# Patient Record
Sex: Male | Born: 2005 | Race: Black or African American | Hispanic: No | Marital: Single | Smoking: Never smoker
Health system: Southern US, Community
[De-identification: ages and names within clinical notes are randomized; demographics above are authoritative.]

## PROBLEM LIST (undated history)

## (undated) ENCOUNTER — Emergency Department (HOSPITAL_BASED_OUTPATIENT_CLINIC_OR_DEPARTMENT_OTHER): Payer: Managed Care, Other (non HMO) | Source: Home / Self Care

## (undated) DIAGNOSIS — F840 Autistic disorder: Secondary | ICD-10-CM

---
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
## 2023-04-20 DIAGNOSIS — Z419 Encounter for procedure for purposes other than remedying health state, unspecified: Secondary | ICD-10-CM | POA: Diagnosis not present

## 2023-05-21 DIAGNOSIS — Z419 Encounter for procedure for purposes other than remedying health state, unspecified: Secondary | ICD-10-CM | POA: Diagnosis not present

## 2023-06-20 ENCOUNTER — Ambulatory Visit: Payer: 59 | Admitting: Family Medicine

## 2023-06-20 DIAGNOSIS — Z419 Encounter for procedure for purposes other than remedying health state, unspecified: Secondary | ICD-10-CM | POA: Diagnosis not present

## 2023-06-30 ENCOUNTER — Ambulatory Visit (INDEPENDENT_AMBULATORY_CARE_PROVIDER_SITE_OTHER): Payer: 59 | Admitting: Family Medicine

## 2023-06-30 ENCOUNTER — Encounter: Payer: Self-pay | Admitting: Family Medicine

## 2023-06-30 VITALS — BP 124/82 | HR 78 | Temp 98.9°F | Ht 74.0 in | Wt 293.0 lb

## 2023-06-30 DIAGNOSIS — J069 Acute upper respiratory infection, unspecified: Secondary | ICD-10-CM | POA: Insufficient documentation

## 2023-06-30 DIAGNOSIS — Z79899 Other long term (current) drug therapy: Secondary | ICD-10-CM | POA: Insufficient documentation

## 2023-06-30 DIAGNOSIS — Z91018 Allergy to other foods: Secondary | ICD-10-CM | POA: Insufficient documentation

## 2023-06-30 DIAGNOSIS — L309 Dermatitis, unspecified: Secondary | ICD-10-CM | POA: Insufficient documentation

## 2023-06-30 DIAGNOSIS — Z68.41 Body mass index (BMI) pediatric, greater than or equal to 95th percentile for age: Secondary | ICD-10-CM | POA: Insufficient documentation

## 2023-06-30 DIAGNOSIS — F849 Pervasive developmental disorder, unspecified: Secondary | ICD-10-CM | POA: Insufficient documentation

## 2023-06-30 DIAGNOSIS — F902 Attention-deficit hyperactivity disorder, combined type: Secondary | ICD-10-CM | POA: Diagnosis not present

## 2023-06-30 DIAGNOSIS — H6120 Impacted cerumen, unspecified ear: Secondary | ICD-10-CM | POA: Insufficient documentation

## 2023-06-30 DIAGNOSIS — R030 Elevated blood-pressure reading, without diagnosis of hypertension: Secondary | ICD-10-CM | POA: Diagnosis not present

## 2023-06-30 DIAGNOSIS — E669 Obesity, unspecified: Secondary | ICD-10-CM

## 2023-06-30 DIAGNOSIS — Z00129 Encounter for routine child health examination without abnormal findings: Secondary | ICD-10-CM | POA: Insufficient documentation

## 2023-06-30 DIAGNOSIS — Z719 Counseling, unspecified: Secondary | ICD-10-CM | POA: Insufficient documentation

## 2023-06-30 DIAGNOSIS — Z713 Dietary counseling and surveillance: Secondary | ICD-10-CM | POA: Insufficient documentation

## 2023-06-30 DIAGNOSIS — IMO0002 Reserved for concepts with insufficient information to code with codable children: Secondary | ICD-10-CM | POA: Insufficient documentation

## 2023-06-30 DIAGNOSIS — F840 Autistic disorder: Secondary | ICD-10-CM | POA: Diagnosis not present

## 2023-06-30 DIAGNOSIS — I10 Essential (primary) hypertension: Secondary | ICD-10-CM | POA: Insufficient documentation

## 2023-06-30 DIAGNOSIS — Z7182 Exercise counseling: Secondary | ICD-10-CM | POA: Insufficient documentation

## 2023-06-30 DIAGNOSIS — R6250 Unspecified lack of expected normal physiological development in childhood: Secondary | ICD-10-CM | POA: Insufficient documentation

## 2023-06-30 DIAGNOSIS — R197 Diarrhea, unspecified: Secondary | ICD-10-CM | POA: Insufficient documentation

## 2023-06-30 NOTE — Assessment & Plan Note (Signed)
Chronic, controlled, unmedicated since he has been out of school.

## 2023-06-30 NOTE — Assessment & Plan Note (Signed)
Mother requests authorization form for day program, will complete upon receipt. I have also requested medical records from [facility].

## 2023-06-30 NOTE — Progress Notes (Signed)
New Patient Office Visit  Subjective    Patient ID: Jeremy Ibarra, male    DOB: 08-Jan-2006  Age: 17 y.o. MRN: [id number]  CC:  Chief Complaint  Patient presents with   Establish Care    HPI Jeremy Ibarra presents to establish care. Oriented to practice routines and expectations. PMH includes autism and ADHD. His mother is with him today and she requests an authorization form for [facility]. His mother has also requested state psychiatry evaluations and will request medical records from [facility]. His BP is elevated in office today, mother reports history of BP elevations, no workup has been done for this. He denies chest pain, palpitations, dyspnea on exertion, recurrent headaches, vision changes.   Vaccines:  UTD    Outpatient Encounter Medications as of 06/30/2023  Medication Sig   cloNIDine HCl (KAPVAY) 0.1 MG TB12 ER tablet Take 0.1 mg by mouth at bedtime.   fluocinonide cream (LIDEX) 0.05 % Apply 1 Application topically 2 (two) times daily.   acetaminophen (TYLENOL) 160 MG/5ML solution Take 320 mg by mouth every 4 (four) hours as needed. For fever (Patient not taking: Reported on 06/30/2023)   dexmethylphenidate (FOCALIN XR) 10 MG 24 hr capsule Take 10 mg by mouth daily. (Patient not taking: Reported on 06/30/2023)   erythromycin ophthalmic ointment Place a 1/2 inch ribbon of ointment into the lower eyelid 4 times daily for 7 days. (Patient not taking: Reported on 06/30/2023)   fluticasone (FLONASE) 50 MCG/ACT nasal spray Place 1 spray into both nostrils daily for 3 days.   oseltamivir (TAMIFLU) 75 MG capsule Take 1 capsule (75 mg total) by mouth every 12 (twelve) hours. (Patient not taking: Reported on 06/30/2023)   No facility-administered encounter medications on file as of 06/30/2023.    Past Medical History:  Diagnosis Date   Autism     History reviewed. No pertinent surgical history.  History reviewed. No pertinent family history.  Social  History   Socioeconomic History   Marital status: Single    Spouse name: Not on file   Number of children: Not on file   Years of education: Not on file   Highest education level: Not on file  Occupational History   Not on file  Tobacco Use   Smoking status: Never   Smokeless tobacco: Never  Substance and Sexual Activity   Alcohol use: Never    Comment:  pt is 17yo   Drug use: Never   Sexual activity: Never  Other Topics Concern   Not on file  Social History Narrative   Not on file   Social Determinants of Health   Financial Resource Strain: Not on file  Food Insecurity: Not on file  Transportation Needs: Not on file  Physical Activity: Not on file  Stress: Not on file  Social Connections: Not on file  Intimate Partner Violence: Not on file    Review of Systems  Constitutional: Negative.   HENT: Negative.    Eyes: Negative.   Respiratory: Negative.    Cardiovascular: Negative.   Gastrointestinal: Negative.   Genitourinary: Negative.   Musculoskeletal: Negative.   Skin: Negative.   Neurological: Negative.   Endo/Heme/Allergies: Negative.   Psychiatric/Behavioral: Negative.    All other systems reviewed and are negative.       Objective    BP 124/82   Pulse 78   Temp 98.9 F (37.2 C) (Oral)   Ht 6\' 2"  (1.88 m)   Wt 293 lb (132.9 kg)  SpO2 98%   BMI 37.62 kg/m   Physical Exam Vitals and nursing note reviewed.  Constitutional:      Appearance: Normal appearance. He is normal weight.  HENT:     Head: Normocephalic and atraumatic.     Right Ear: Tympanic membrane, ear canal and external ear normal.     Left Ear: Tympanic membrane, ear canal and external ear normal.     Nose: Nose normal.     Mouth/Throat:     Mouth: Mucous membranes are moist.     Pharynx: Oropharynx is clear.  Eyes:     Extraocular Movements: Extraocular movements intact.     Right eye: Normal extraocular motion and no nystagmus.     Left eye: Normal extraocular motion and no  nystagmus.     Conjunctiva/sclera: Conjunctivae normal.     Pupils: Pupils are equal, round, and reactive to light.  Cardiovascular:     Rate and Rhythm: Normal rate and regular rhythm.     Pulses: Normal pulses.     Heart sounds: Normal heart sounds.  Pulmonary:     Effort: Pulmonary effort is normal.     Breath sounds: Normal breath sounds.  Abdominal:     General: Bowel sounds are normal.     Palpations: Abdomen is soft.  Genitourinary:    Comments: Deferred using shared decision making Musculoskeletal:        General: Normal range of motion.     Cervical back: Normal range of motion and neck supple.  Skin:    General: Skin is warm and dry.     Capillary Refill: Capillary refill takes less than 2 seconds.  Neurological:     General: No focal deficit present.     Mental Status: He is alert. Mental status is at baseline.  Psychiatric:        Mood and Affect: Mood normal.        Speech: Speech normal.        Behavior: Behavior normal.        Thought Content: Thought content normal.        Cognition and Memory: Cognition and memory normal.        Judgment: Judgment normal.         Assessment & Plan:   Problem List Items Addressed This Visit     Autism - Primary    Mother requests authorization form for day program, will complete upon receipt. I have also requested medical records from [facility].      Elevated blood pressure reading    BP elevated today. Will recheck when he returns for labs and treat as indicated. Counseled on importance of weight management, heart healthy diet, and 150 minutes moderate intensity exercise weekly. Seek medical care for chest pain, dyspnea on exertion, vision changes, lightheadedness, recurrent headaches. 124/82 on recheck, he does endorse some anxiety regarding todays visit.      Relevant Orders   CBC with Differential/Platelet   COMPLETE METABOLIC PANEL WITH GFR   Lipid panel   TSH   Attention deficit hyperactivity  disorder (ADHD), combined type, moderate    Chronic, controlled, unmedicated since he has been out of school.      Other Visit Diagnoses     Obesity (BMI 35.0-39.9 without comorbidity)       Relevant Orders   CBC with Differential/Platelet   COMPLETE METABOLIC PANEL WITH GFR   Lipid panel   TSH       Return for labs.   Hospital doctor  Jeannette How, FNP

## 2023-06-30 NOTE — Assessment & Plan Note (Signed)
BP elevated today. Will recheck when he returns for labs and treat as indicated. Counseled on importance of weight management, heart healthy diet, and 150 minutes moderate intensity exercise weekly. Seek medical care for chest pain, dyspnea on exertion, vision changes, lightheadedness, recurrent headaches. 124/82 on recheck, he does endorse some anxiety regarding todays visit.

## 2023-06-30 NOTE — Patient Instructions (Signed)
It was great to meet you today and I'm excited to have you join the [facility] practice. I hope you had a positive experience today! If you feel so inclined, please feel free to recommend our practice to friends and family. Zaydon Kinser, FNP-C  

## 2023-07-06 ENCOUNTER — Other Ambulatory Visit: Payer: 59

## 2023-07-08 ENCOUNTER — Other Ambulatory Visit: Payer: 59

## 2023-07-08 ENCOUNTER — Telehealth: Payer: Self-pay

## 2023-07-08 LAB — CBC WITH DIFFERENTIAL/PLATELET
Basophils Absolute: 37 cells/uL (ref 0–200)
MCH: 27 pg (ref 25.0–35.0)
MPV: 10 fL (ref 7.5–12.5)
Monocytes Relative: 8.8 %
RDW: 13.3 % (ref 11.0–15.0)

## 2023-07-08 NOTE — Telephone Encounter (Signed)
Pt's mom brought in an authorization request form to be completed by pcp. Pt's mom asks once forms are completed to be faxed to the number provided on form. Pt's mom completed an intake form. Intake form and authorization forms placed in nurse's folder. Please contact pt's mom once forms are completed and faxed.   Cb#: [phone]

## 2023-07-09 LAB — CBC WITH DIFFERENTIAL/PLATELET
Absolute Monocytes: 546 cells/uL (ref 200–900)
Basophils Relative: 0.6 %
Eosinophils Absolute: 143 cells/uL (ref 15–500)
Eosinophils Relative: 2.3 %
HCT: 47.4 % (ref 36.0–49.0)
Hemoglobin: 15.4 g/dL (ref 12.0–16.9)
Lymphs Abs: 1841 cells/uL (ref 1200–5200)
MCHC: 32.5 g/dL (ref 31.0–36.0)
MCV: 83 fL (ref 78.0–98.0)
Neutro Abs: 3633 cells/uL (ref 1800–8000)
Neutrophils Relative %: 58.6 %
Platelets: 232 10*3/uL (ref 140–400)
RBC: 5.71 10*6/uL — ABNORMAL HIGH (ref 4.10–5.70)
Total Lymphocyte: 29.7 %
WBC: 6.2 10*3/uL (ref 4.5–13.0)

## 2023-07-09 LAB — COMPLETE METABOLIC PANEL WITH GFR
AG Ratio: 1.5 (calc) (ref 1.0–2.5)
ALT: 14 U/L (ref 8–46)
AST: 14 U/L (ref 12–32)
Albumin: 4.7 g/dL (ref 3.6–5.1)
Alkaline phosphatase (APISO): 62 U/L (ref 46–169)
BUN: 11 mg/dL (ref 7–20)
CO2: 24 mmol/L (ref 20–32)
Calcium: 9.5 mg/dL (ref 8.9–10.4)
Chloride: 104 mmol/L (ref 98–110)
Creat: 1.04 mg/dL (ref 0.60–1.24)
Globulin: 3.1 g/dL (calc) (ref 2.1–3.5)
Glucose, Bld: 94 mg/dL (ref 65–99)
Potassium: 4.2 mmol/L (ref 3.8–5.1)
Sodium: 137 mmol/L (ref 135–146)
Total Bilirubin: 0.4 mg/dL (ref 0.2–1.1)
Total Protein: 7.8 g/dL (ref 6.3–8.2)
eGFR: 107 mL/min/{1.73_m2} (ref >=60)

## 2023-07-09 LAB — TSH: TSH: 0.69 mIU/L (ref 0.50–4.30)

## 2023-07-09 LAB — LIPID PANEL
Cholesterol: 161 mg/dL (ref <170)
HDL: 42 mg/dL — ABNORMAL LOW (ref >45)
LDL Cholesterol (Calc): 106 mg/dL (calc) (ref <110)
Non-HDL Cholesterol (Calc): 119 mg/dL (calc) (ref <120)
Total CHOL/HDL Ratio: 3.8 (calc) (ref <5.0)
Triglycerides: 45 mg/dL (ref <90)

## 2023-07-19 ENCOUNTER — Other Ambulatory Visit: Payer: Self-pay

## 2023-07-19 ENCOUNTER — Telehealth: Payer: Self-pay

## 2023-07-19 NOTE — Telephone Encounter (Signed)
authorization request form completed per FNP and give to front desk for pt to pick up.

## 2023-07-19 NOTE — Telephone Encounter (Signed)
Called pt's mom to let know ppw was available to pick up. Copy of ppw made for pt's chart.

## 2023-07-20 ENCOUNTER — Telehealth: Payer: Self-pay

## 2023-07-20 NOTE — Telephone Encounter (Signed)
Talked to mom this afternoon about pt's referral for Adult Day Program with [facility]. Told mom that per [facility] does not provide that service, however, I with check with [facility]'s provider relation's dept to find out where pt could get Adult Day program service that they are in contract with.  Well call mom back on Friday or next Monday at the latest. Voiced understanding per mom

## 2023-07-21 DIAGNOSIS — Z419 Encounter for procedure for purposes other than remedying health state, unspecified: Secondary | ICD-10-CM | POA: Diagnosis not present

## 2023-07-29 ENCOUNTER — Telehealth: Payer: Self-pay

## 2023-07-29 NOTE — Telephone Encounter (Signed)
Today pt's mom called regarding the PA for Adult Day Program.   After talking w/[facility] Provider Service-Rachel K [phone] confirmed that [facility] adult day program is 'Peer Support' which includes the following servics (speech delay service, autism, PDD and any mental disorders, community service, etc.)  Told pt's mom that per [facility], the first 24 units/sessions does not required a PA only after the 24th units. There is a provider in [place], called [facility] [phone],  Told pt's mom I will disregard this current PA and to call back after 24th session and we can fill out a new PA for continuation of service. Per mom agreed. Nothing further.

## 2023-08-16 ENCOUNTER — Telehealth: Payer: Self-pay

## 2023-08-16 NOTE — Telephone Encounter (Signed)
Pt's mom brought in ppw to be completed by pcp for pt to attend a day program. Once form is completed please fax to number provided on ppw. Please call pt's mom once ppw is completed, faxed, and available for pt's mom to pick up. Intake form completed and placed in nurse's folder for completion. Please advise.  Cb#: [phone]

## 2023-08-19 ENCOUNTER — Other Ambulatory Visit: Payer: 59

## 2023-08-23 ENCOUNTER — Other Ambulatory Visit: Payer: 59

## 2023-08-23 NOTE — Telephone Encounter (Signed)
Put in FNP green folder to be completed. Will notify pt when it's done.

## 2023-08-31 ENCOUNTER — Other Ambulatory Visit: Payer: 59

## 2023-08-31 ENCOUNTER — Other Ambulatory Visit: Payer: Self-pay

## 2023-08-31 DIAGNOSIS — Z111 Encounter for screening for respiratory tuberculosis: Secondary | ICD-10-CM

## 2023-09-03 LAB — QUANTIFERON-TB GOLD PLUS
Mitogen-NIL: >10 [IU]/mL
NIL: 0.03 [IU]/mL
QuantiFERON-TB Gold Plus: NEGATIVE
TB1-NIL: 0 [IU]/mL
TB2-NIL: 0 [IU]/mL

## 2023-09-13 ENCOUNTER — Other Ambulatory Visit (HOSPITAL_COMMUNITY): Payer: Self-pay

## 2023-09-13 ENCOUNTER — Ambulatory Visit: Payer: 59 | Admitting: Family Medicine

## 2023-09-13 ENCOUNTER — Encounter: Payer: Self-pay | Admitting: Family Medicine

## 2023-09-13 VITALS — BP 140/74 | HR 83 | Temp 97.8°F | Ht 74.0 in | Wt 293.0 lb

## 2023-09-13 DIAGNOSIS — R030 Elevated blood-pressure reading, without diagnosis of hypertension: Secondary | ICD-10-CM

## 2023-09-13 DIAGNOSIS — F840 Autistic disorder: Secondary | ICD-10-CM | POA: Diagnosis not present

## 2023-09-13 MED ORDER — BLOOD PRESSURE KIT
1.0000 | PACK | Freq: Once | 0 refills | Status: AC
  Filled 2023-09-13: qty 1, fill #0

## 2023-09-13 NOTE — Assessment & Plan Note (Signed)
I have completed forms for Jeremy Ibarra all day program today with the assistance of his mother who provided medical history. Front Information systems manager to fax.

## 2023-09-13 NOTE — Progress Notes (Signed)
Subjective:  HPI: Jeremy Ibarra is a 17 y.o. male presenting on 09/13/2023 for No chief complaint on file.   HPI Patient is in today for form completion for his all day program accompanied today by his mother. Mr Reum has a PMH of autism and ADHD and will be starting an ADP soon. Forms were completed with assistance from his mother as historian for medical information. His BP was elevated on initial exam and remained 140/74 on recheck. They do not have a home blood pressure monitor, I have encouraged them to obtain one and check BP at home and report to office if readings sustain >140/90.   Review of Systems  All other systems reviewed and are negative.   Relevant past medical history reviewed and updated as indicated.   Past Medical History:  Diagnosis Date   Autism      No past surgical history on file.  Allergies and medications reviewed and updated.   Current Outpatient Medications:    Blood Pressure KIT, 1 kit by Does not apply route once for 1 dose., Disp: 1 kit, Rfl: 0   fluocinonide cream (LIDEX) 0.05 %, Apply 1 Application topically 2 (two) times daily., Disp: , Rfl:    acetaminophen (TYLENOL) 160 MG/5ML solution, Take 320 mg by mouth every 4 (four) hours as needed. For fever (Patient not taking: Reported on 06/30/2023), Disp: , Rfl:    cloNIDine HCl (KAPVAY) 0.1 MG TB12 ER tablet, Take 0.1 mg by mouth at bedtime., Disp: , Rfl:    dexmethylphenidate (FOCALIN XR) 10 MG 24 hr capsule, Take 10 mg by mouth daily. (Patient not taking: Reported on 06/30/2023), Disp: , Rfl:    erythromycin ophthalmic ointment, Place a 1/2 inch ribbon of ointment into the lower eyelid 4 times daily for 7 days. (Patient not taking: Reported on 06/30/2023), Disp: 3.5 g, Rfl: 0   fluticasone (FLONASE) 50 MCG/ACT nasal spray, Place 1 spray into both nostrils daily for 3 days., Disp: 16 g, Rfl: 0   oseltamivir (TAMIFLU) 75 MG capsule, Take 1 capsule (75 mg total) by mouth every 12 (twelve) hours.  (Patient not taking: Reported on 06/30/2023), Disp: 10 capsule, Rfl: 0  Allergies  Allergen Reactions   Banana     Came up as allergic in an allergy test   Egg-Derived Products     Came up as allergic in an allergy test    Objective:   BP (!) 140/74 (BP Location: Left Arm, Patient Position: Sitting, Cuff Size: Large)   Pulse 83   Temp 97.8 F (36.6 C) (Oral)   Ht 6\' 2"  (1.88 m)   Wt 293 lb (132.9 kg)   SpO2 98%   BMI 37.62 kg/m      09/13/2023    2:40 PM 09/13/2023    2:19 PM 09/13/2023    2:16 PM  Vitals with BMI  Height   6\' 2"   Weight   293 lbs  BMI   37.6  Systolic 140 150 478  Diastolic 74 88 100  Pulse   83     Physical Exam Vitals and nursing note reviewed.  Constitutional:      Appearance: Normal appearance. He is normal weight.  HENT:     Head: Normocephalic and atraumatic.  Skin:    General: Skin is warm and dry.     Capillary Refill: Capillary refill takes less than 2 seconds.  Neurological:     General: No focal deficit present.     Mental Status: He is  alert and oriented to person, place, and time. Mental status is at baseline.  Psychiatric:        Mood and Affect: Mood normal.        Behavior: Behavior normal.        Thought Content: Thought content normal.        Judgment: Judgment normal.     Assessment & Plan:  Autism Assessment & Plan: I have completed forms for Mr Stadler all day program today with the assistance of his mother who provided medical history. Front Information systems manager to fax.   Elevated blood pressure reading Assessment & Plan: BP again elevated on initial check and improved on recheck. Will provide order for home BP cuff for monitoring. Return to office if home readings sustain >140/90.   Other orders -     Blood Pressure; 1 kit by Does not apply route once for 1 dose.  Dispense: 1 kit; Refill: 0     Follow up plan: Return if symptoms worsen or fail to improve.  Park Meo, FNP

## 2023-09-13 NOTE — Assessment & Plan Note (Signed)
BP again elevated on initial check and improved on recheck. Will provide order for home BP cuff for monitoring. Return to office if home readings sustain >140/90.

## 2023-09-29 ENCOUNTER — Telehealth: Payer: Self-pay

## 2023-09-29 MED ORDER — FLUOCINONIDE 0.05 % EX CREA: 1.0000 | TOPICAL_CREAM | Freq: Two times a day (BID) | CUTANEOUS | 3 refills | Status: DC

## 2023-09-29 MED ORDER — COMFORT TOUCH BP CUFF/LARGE MISC: 0 refills | Status: DC

## 2023-09-29 NOTE — Telephone Encounter (Signed)
Spoke w/pt's mom, aware Rx for bp cuff and lidex 0.05% has been sent to pharmacy.

## 2023-09-29 NOTE — Telephone Encounter (Signed)
Pt's mom called in to check on the status of pt's bp cuff being called into pharmacy. Please advise.  Cb#: [phone]

## 2023-12-23 ENCOUNTER — Telehealth: Payer: Self-pay

## 2023-12-23 NOTE — Telephone Encounter (Signed)
 Rec' form fax from [place] [employer] re: Pt  Called and spoke w/mom, per NP wanted to find out why pt can't take the bus.   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 Mom, does not pt to be around people who don't understand Autism. Stated that if pt rides the bus, he would need an assistance or someone with him.   Put forms back on NP's desk to be completed.

## 2023-12-26 NOTE — Telephone Encounter (Signed)
 Paper work completed per pt's mom requested per NP and put up front to be faxed to [place] [address] [place] [address]. This morning.

## 2024-02-20 ENCOUNTER — Ambulatory Visit (INDEPENDENT_AMBULATORY_CARE_PROVIDER_SITE_OTHER): Payer: MEDICAID | Admitting: Family Medicine

## 2024-02-20 ENCOUNTER — Encounter: Payer: Self-pay | Admitting: Family Medicine

## 2024-02-20 VITALS — BP 138/88 | HR 92 | Temp 97.9°F | Ht 74.0 in | Wt 309.0 lb

## 2024-02-20 DIAGNOSIS — J3089 Other allergic rhinitis: Secondary | ICD-10-CM | POA: Diagnosis not present

## 2024-02-20 MED ORDER — HYPROMELLOSE (GONIOSCOPIC) 2.5 % OP SOLN: 1.0000 [drp] | Freq: Every day | OPHTHALMIC | 12 refills | Status: DC | PRN

## 2024-02-20 MED ORDER — FLUOCINONIDE 0.05 % EX SOLN: 1.0000 | Freq: Two times a day (BID) | CUTANEOUS | 0 refills | Status: DC

## 2024-02-20 NOTE — Progress Notes (Signed)
 Subjective:  HPI: Jeremy Ibarra is a 18 y.o. male presenting on 02/20/2024 for No chief complaint on file.   HPI Patient is in today for watery eyes that is mostly resolved as reported by his mother who aids in history. He and his mother deny redness, itching, vision changes, purulent drainage, crusting. He does not have associated allergy symptoms including sneezing, cough, runny nose. Has tried nothing.   Review of Systems  All other systems reviewed and are negative.   Relevant past medical history reviewed and updated as indicated.   Past Medical History:  Diagnosis Date   Autism      History reviewed. No pertinent surgical history.  Allergies and medications reviewed and updated.   Current Outpatient Medications:    acetaminophen (TYLENOL) 160 MG/5ML solution, Take 320 mg by mouth every 4 (four) hours as needed. For fever, Disp: , Rfl:    Blood Pressure Monitoring (COMFORT TOUCH BP CUFF/LARGE) MISC, Use as directed, Disp: 1 each, Rfl: 0   cloNIDine HCl (KAPVAY) 0.1 MG TB12 ER tablet, Take 0.1 mg by mouth at bedtime., Disp: , Rfl:    dexmethylphenidate (FOCALIN XR) 10 MG 24 hr capsule, Take 10 mg by mouth daily., Disp: , Rfl:    erythromycin ophthalmic ointment, Place a 1/2 inch ribbon of ointment into the lower eyelid 4 times daily for 7 days., Disp: 3.5 g, Rfl: 0   fluocinonide (LIDEX) 0.05 % external solution, Apply 1 Application topically 2 (two) times daily., Disp: 60 mL, Rfl: 0   hydroxypropyl methylcellulose / hypromellose (ISOPTO TEARS / GONIOVISC) 2.5 % ophthalmic solution, Place 1 drop into both eyes daily as needed for dry eyes., Disp: 15 mL, Rfl: 12   oseltamivir (TAMIFLU) 75 MG capsule, Take 1 capsule (75 mg total) by mouth every 12 (twelve) hours., Disp: 10 capsule, Rfl: 0   fluticasone (FLONASE) 50 MCG/ACT nasal spray, Place 1 spray into both nostrils daily for 3 days., Disp: 16 g, Rfl: 0  Allergies  Allergen Reactions   Banana     Came up as allergic in an  allergy test   Egg-Derived Products     Came up as allergic in an allergy test    Objective:   BP 138/88 (BP Location: Right Arm, Patient Position: Sitting, Cuff Size: Large)   Pulse 92   Temp 97.9 F (36.6 C) (Oral)   Ht 6\' 2"  (1.88 m)   Wt (!) 309 lb (140.2 kg)   SpO2 97%   BMI 39.67 kg/m      02/20/2024    3:52 PM 02/20/2024    3:44 PM 02/20/2024    3:39 PM  Vitals with BMI  Height   6\' 2"   Weight   309 lbs  BMI   39.66  Systolic 138 150 161  Diastolic 88 96 110  Pulse   92     Physical Exam Vitals and nursing note reviewed.  Constitutional:      Appearance: Normal appearance. He is normal weight.  HENT:     Head: Normocephalic and atraumatic.  Eyes:     General: Lids are normal. Vision grossly intact. Gaze aligned appropriately.        Right eye: No discharge.        Left eye: No discharge.     Extraocular Movements: Extraocular movements intact.     Conjunctiva/sclera: Conjunctivae normal.  Skin:    General: Skin is warm and dry.     Capillary Refill: Capillary refill takes less than 2  seconds.  Neurological:     General: No focal deficit present.     Mental Status: He is alert and oriented to person, place, and time. Mental status is at baseline.  Psychiatric:        Mood and Affect: Mood normal.        Behavior: Behavior normal.        Thought Content: Thought content normal.        Judgment: Judgment normal.     Assessment & Plan:  Environmental and seasonal allergies Assessment & Plan: No injected conjunctiva or drainage observed. May use artificial tears PRN. Has been using Claritin and seen some improvement. Return to office PRN.    Other orders -     Fluocinonide; Apply 1 Application topically 2 (two) times daily.  Dispense: 60 mL; Refill: 0 -     Hypromellose (Gonioscopic); Place 1 drop into both eyes daily as needed for dry eyes.  Dispense: 15 mL; Refill: 12     Follow up plan: Return if symptoms worsen or fail to improve.  Park Meo, FNP

## 2024-02-20 NOTE — Assessment & Plan Note (Signed)
 No injected conjunctiva or drainage observed. May use artificial tears PRN. Has been using Claritin and seen some improvement. Return to office PRN.

## 2024-09-04 ENCOUNTER — Encounter: Payer: MEDICAID | Admitting: Family Medicine

## 2024-09-04 ENCOUNTER — Ambulatory Visit: Payer: MEDICAID | Admitting: Family Medicine

## 2024-09-11 ENCOUNTER — Ambulatory Visit: Payer: MEDICAID | Admitting: Family Medicine

## 2024-09-11 ENCOUNTER — Encounter: Payer: Self-pay | Admitting: Family Medicine

## 2024-09-11 VITALS — BP 130/90 | HR 89 | Temp 98.2°F | Ht 74.0 in | Wt 312.6 lb

## 2024-09-11 DIAGNOSIS — I10 Essential (primary) hypertension: Secondary | ICD-10-CM | POA: Diagnosis not present

## 2024-09-11 DIAGNOSIS — F902 Attention-deficit hyperactivity disorder, combined type: Secondary | ICD-10-CM | POA: Diagnosis not present

## 2024-09-11 DIAGNOSIS — Z0000 Encounter for general adult medical examination without abnormal findings: Secondary | ICD-10-CM | POA: Insufficient documentation

## 2024-09-11 DIAGNOSIS — L730 Acne keloid: Secondary | ICD-10-CM

## 2024-09-11 DIAGNOSIS — Z0001 Encounter for general adult medical examination with abnormal findings: Secondary | ICD-10-CM | POA: Diagnosis not present

## 2024-09-11 DIAGNOSIS — F840 Autistic disorder: Secondary | ICD-10-CM

## 2024-09-11 MED ORDER — FLUOCINONIDE 0.05 % EX SOLN: 1.0000 | Freq: Two times a day (BID) | CUTANEOUS | 0 refills | Status: AC

## 2024-09-11 MED ORDER — FLUOCINONIDE 0.05 % EX SOLN: 1.0000 | Freq: Two times a day (BID) | CUTANEOUS | 0 refills | Status: DC

## 2024-09-11 MED ORDER — CLINDAMYCIN PHOS-BENZOYL PEROX 1.2-2.5 % EX GEL: 1.0000 | Freq: Every day | CUTANEOUS | 0 refills | Status: AC

## 2024-09-11 NOTE — Assessment & Plan Note (Signed)
Today your medical history was reviewed and routine physical exam with labs was performed. Recommend 150 minutes of moderate intensity exercise weekly and consuming a well-balanced diet. Counseled in mental health awareness and when to seek medical care. Vaccine maintenance discussed. Appropriate health maintenance items reviewed. Return to office in 1 year for annual physical exam.

## 2024-09-11 NOTE — Progress Notes (Signed)
 Complete physical exam  Patient: Jeremy Ibarra   DOB: August 15, 2006   18 y.o. Male  MRN: [id number]  Subjective:    Chief Complaint  Patient presents with   Annual Exam    Jeremy Ibarra is a 18 y.o. male who presents today with his mother for a complete physical exam. He reports consuming a general diet. Walking and plays basketball at the [employer] He generally feels well. He reports sleeping well. He does not have additional problems to discuss today.   PMH includes Autism, ADHD, HTN, obesity.  Autism: participates in a day program  ADHD: not currently medicated, well controlled  HTN: working on diet and exercise  Obesity: working on diet and exercise, mother is seeing a nutritionist and planning to incorporate healthier meals.   Acne keloidalis  Health Maintenance  Topic Date Due   HPV VACCINES (1 - Male 3-dose series) Never done   HIV Screening  Never done   Meningococcal B Vaccine (1 of 2 - Standard) Never done   Hepatitis C Screening  Never done   COVID-19 Vaccine (1 - 2024-25 season) 09/27/2024 (Originally 08/20/2024)   Influenza Vaccine  10/03/2024 (Originally 07/20/2024)   DTaP/Tdap/Td (7 - Td or Tdap) 04/13/2026   Hepatitis B Vaccines 19-59 Average Risk  Completed   Pneumococcal Vaccine  Aged Out     Most recent fall risk assessment:    06/30/2023    3:59 PM  Fall Risk   Falls in the past year? 0  Number falls in past yr: 0  Injury with Fall? 0  Risk for fall due to : No Fall Risks     Most recent depression screenings:    06/30/2023    4:00 PM 03/31/2017    6:02 PM  PHQ 2/9 Scores  PHQ - 2 Score 0 0  PHQ- 9 Score 0 0    Vision:Not within last year  and Dental: No current dental problems and Receives regular dental care  Patient Active Problem List   Diagnosis Date Noted   Acne keloidalis 09/11/2024   Physical exam, annual 09/11/2024   Developmental delay 06/30/2023   Attention deficit hyperactivity disorder (ADHD), combined type, moderate 06/30/2023    Eczema 06/30/2023   Hypertension, essential 06/30/2023   Morbid obesity, unspecified obesity type (HCC) 06/30/2023   PDD (pervasive developmental disorder) 06/30/2023   Autism 03/31/2017   Past Medical History:  Diagnosis Date   Autism    History reviewed. No pertinent surgical history. Social History   Tobacco Use   Smoking status: Never   Smokeless tobacco: Never  Vaping Use   Vaping status: Never Used  Substance Use Topics   Alcohol use: Never    Comment:  pt is 18yo   Drug use: Never   History reviewed. No pertinent family history. Allergies  Allergen Reactions   Banana     Came up as allergic in an allergy test   Egg-Derived Products     Came up as allergic in an allergy test      Patient Care Team: Kayla Jeoffrey RAMAN, FNP as PCP - General (Family Medicine)   Outpatient Medications Prior to Visit  Medication Sig   acetaminophen  (TYLENOL ) 160 MG/5ML solution Take 320 mg by mouth every 4 (four) hours as needed. For fever (Patient taking differently: Take 320 mg by mouth every 4 (four) hours as needed for mild pain (pain score 1-3). For fever)   fluticasone  (FLONASE ) 50 MCG/ACT nasal spray Place 1 spray into both nostrils daily for  3 days. (Patient taking differently: Place 1 spray into both nostrils as needed for allergies or rhinitis.)   [DISCONTINUED] fluocinonide  (LIDEX ) 0.05 % external solution Apply 1 Application topically 2 (two) times daily.   [DISCONTINUED] Blood Pressure Monitoring (COMFORT TOUCH BP CUFF/LARGE) MISC Use as directed   [DISCONTINUED] cloNIDine HCl (KAPVAY) 0.1 MG TB12 ER tablet Take 0.1 mg by mouth at bedtime.   [DISCONTINUED] dexmethylphenidate (FOCALIN XR) 10 MG 24 hr capsule Take 10 mg by mouth daily. (Patient not taking: Reported on 09/11/2024)   [DISCONTINUED] erythromycin  ophthalmic ointment Place a 1/2 inch ribbon of ointment into the lower eyelid 4 times daily for 7 days.   [DISCONTINUED] hydroxypropyl methylcellulose / hypromellose (ISOPTO  TEARS / GONIOVISC) 2.5 % ophthalmic solution Place 1 drop into both eyes daily as needed for dry eyes.   [DISCONTINUED] oseltamivir  (TAMIFLU ) 75 MG capsule Take 1 capsule (75 mg total) by mouth every 12 (twelve) hours.   No facility-administered medications prior to visit.    Review of Systems  Constitutional: Negative.   HENT: Negative.    Eyes: Negative.   Respiratory: Negative.    Cardiovascular: Negative.   Gastrointestinal: Negative.   Genitourinary: Negative.   Musculoskeletal: Negative.   Skin: Negative.   Neurological: Negative.   Endo/Heme/Allergies: Negative.   Psychiatric/Behavioral: Negative.    All other systems reviewed and are negative.         Objective:     BP (!) 130/90   Pulse 89   Temp 98.2 F (36.8 C)   Ht 6' 2 (1.88 m)   Wt (!) 312 lb 9.6 oz (141.8 kg)   SpO2 98%   BMI 40.14 kg/m  BP Readings from Last 3 Encounters:  09/11/24 (!) 130/90  02/20/24 138/88  09/13/23 (!) 140/74   Wt Readings from Last 3 Encounters:  09/11/24 (!) 312 lb 9.6 oz (141.8 kg)  02/20/24 (!) 309 lb (140.2 kg) (>99%, Z= 3.16)*  09/13/23 293 lb (132.9 kg) (>99%, Z= 2.98)*   * Growth percentiles are based on CDC (Boys, 2-20 Years) data.      Physical Exam Vitals and nursing note reviewed.  Constitutional:      Appearance: Normal appearance. He is obese.  HENT:     Head: Normocephalic and atraumatic.     Right Ear: Tympanic membrane, ear canal and external ear normal.     Left Ear: Tympanic membrane, ear canal and external ear normal.     Nose: Nose normal.     Mouth/Throat:     Mouth: Mucous membranes are moist.     Pharynx: Oropharynx is clear.  Eyes:     Extraocular Movements: Extraocular movements intact.     Right eye: Normal extraocular motion and no nystagmus.     Left eye: Normal extraocular motion and no nystagmus.     Conjunctiva/sclera: Conjunctivae normal.     Pupils: Pupils are equal, round, and reactive to light.  Cardiovascular:     Rate and  Rhythm: Normal rate and regular rhythm.     Pulses: Normal pulses.     Heart sounds: Normal heart sounds.  Pulmonary:     Effort: Pulmonary effort is normal.     Breath sounds: Normal breath sounds.  Abdominal:     General: Bowel sounds are normal.     Palpations: Abdomen is soft.  Genitourinary:    Comments: Deferred using shared decision making Musculoskeletal:        General: Normal range of motion.     Cervical back:  Normal range of motion and neck supple.  Skin:    General: Skin is warm and dry.     Capillary Refill: Capillary refill takes less than 2 seconds.     Findings: Acne present.      Neurological:     General: No focal deficit present.     Mental Status: He is alert. Mental status is at baseline.  Psychiatric:        Mood and Affect: Mood normal.        Speech: Speech normal.        Behavior: Behavior normal.        Thought Content: Thought content normal.        Cognition and Memory: Cognition and memory normal.        Judgment: Judgment normal.      No results found for any visits on 09/11/24. Last CBC Lab Results  Component Value Date   WBC 6.2 07/08/2023   HGB 15.4 07/08/2023   HCT 47.4 07/08/2023   MCV 83.0 07/08/2023   MCH 27.0 07/08/2023   RDW 13.3 07/08/2023   PLT 232 07/08/2023   Last metabolic panel Lab Results  Component Value Date   GLUCOSE 94 07/08/2023   NA 137 07/08/2023   K 4.2 07/08/2023   CL 104 07/08/2023   CO2 24 07/08/2023   BUN 11 07/08/2023   CREATININE 1.04 07/08/2023   EGFR 107 07/08/2023   CALCIUM 9.5 07/08/2023   PROT 7.8 07/08/2023   BILITOT 0.4 07/08/2023   AST 14 07/08/2023   ALT 14 07/08/2023   Last lipids Lab Results  Component Value Date   CHOL 161 07/08/2023   HDL 42 (L) 07/08/2023   LDLCALC 106 07/08/2023   TRIG 45 07/08/2023   CHOLHDL 3.8 07/08/2023   Last hemoglobin A1c No results found for: HGBA1C Last thyroid functions Lab Results  Component Value Date   TSH 0.69 07/08/2023   Last  vitamin D  No results found for: 25OHVITD2, 25OHVITD3, VD25OH Last vitamin B12 and Folate No results found for: VITAMINB12, FOLATE      Assessment & Plan:    Routine Health Maintenance and Physical Exam  Immunization History  Administered Date(s) Administered   DTaP 09/23/2004, 11/23/2004, 02/23/2005, 10/27/2005, 07/25/2009   HIB (PRP-OMP) 09/23/2004, 11/23/2004, 07/26/2005   Hepatitis A, Ped/Adol-2 Dose 08/04/2006, 08/15/2007   Hepatitis B, PED/ADOLESCENT 07-06-2004, 08/25/2004, 05/24/2005   IPV 09/23/2004, 11/23/2004, 05/24/2005, 07/25/2009   MMR 07/26/2005, 07/25/2009   Tdap 04/13/2016   Varicella 10/27/2005, 07/25/2009    Health Maintenance  Topic Date Due   HPV VACCINES (1 - Male 3-dose series) Never done   HIV Screening  Never done   Meningococcal B Vaccine (1 of 2 - Standard) Never done   Hepatitis C Screening  Never done   COVID-19 Vaccine (1 - 2024-25 season) 09/27/2024 (Originally 08/20/2024)   Influenza Vaccine  10/03/2024 (Originally 07/20/2024)   DTaP/Tdap/Td (7 - Td or Tdap) 04/13/2026   Hepatitis B Vaccines 19-59 Average Risk  Completed   Pneumococcal Vaccine  Aged Out    Discussed health benefits of physical activity, and encouraged him to engage in regular exercise appropriate for his age and condition.  Problem List Items Addressed This Visit     Autism   I have completed forms for [facility] today with the assistance of his mother who provided medical history. Front Information systems manager to fax.      Attention deficit hyperactivity disorder (ADHD), combined type, moderate   Stable  without medication      Hypertension, essential   Borderline, continue to monitor. Advised on heart healthy diet and exercise.  Recommend heart healthy diet such as Mediterranean diet with whole grains, fruits, vegetable, fish, lean meats, nuts, and olive oil. Limit salt. Encouraged moderate walking, 3-5 times/week for 30-50 minutes each session. Aim for at least  150 minutes.week. Goal should be pace of 3 miles/hours, or walking 1.5 miles in 30 minutes. Avoid tobacco products. Avoid excess alcohol. Take medications as prescribed and bring medications and blood pressure log with cuff to each office visit. Seek medical care for chest pain, palpitations, shortness of breath with exertion, dizziness/lightheadedness, vision changes, recurrent headaches, or swelling of extremities.       Relevant Orders   CBC with Differential/Platelet   Comprehensive metabolic panel with GFR   Lipid panel   TSH   VITAMIN D  25 Hydroxy (Vit-D Deficiency, Fractures)   Hemoglobin A1c   Morbid obesity, unspecified obesity type (HCC)   Counseled on importance of weight management for overall health. Encouraged low calorie, heart healthy diet and moderate intensity exercise 150 minutes weekly. This is 3-5 times weekly for 30-50 minutes each session. Goal should be pace of 3 miles/hours, or walking 1.5 miles in 30 minutes and include strength training. Discussed risks of obesity.       Relevant Orders   CBC with Differential/Platelet   Comprehensive metabolic panel with GFR   Lipid panel   TSH   VITAMIN D  25 Hydroxy (Vit-D Deficiency, Fractures)   Hemoglobin A1c   Acne keloidalis   Restart Lidex  and start Clindamycin -Benzoyl peroxide Avoid picking, rubbing, or scratching the affected area. Discontinue close shaving, trimming, or razor or clipper edging of the posterior hairline. Avoid irritation from tight-fitting hats, helmets, or high-collared shirts Follow up in 4-6 weeks      Physical exam, annual - Primary   Today your medical history was reviewed and routine physical exam with labs was performed. Recommend 150 minutes of moderate intensity exercise weekly and consuming a well-balanced diet.  Counseled in mental health awareness and when to seek medical care. Vaccine maintenance discussed. Appropriate health maintenance items reviewed. Return to office in 1 year for  annual physical exam.       Relevant Orders   CBC with Differential/Platelet   Comprehensive metabolic panel with GFR   Lipid panel   TSH   VITAMIN D  25 Hydroxy (Vit-D Deficiency, Fractures)   Hemoglobin A1c   Return in about 1 year (around 09/11/2025) for annual physical with labs 1 week prior.     [name] Barrio, FNP

## 2024-09-11 NOTE — Assessment & Plan Note (Addendum)
 Restart Lidex  and start Clindamycin -Benzoyl peroxide Avoid picking, rubbing, or scratching the affected area. Discontinue close shaving, trimming, or razor or clipper edging of the posterior hairline. Avoid irritation from tight-fitting hats, helmets, or high-collared shirts Follow up in 4-6 weeks

## 2024-09-11 NOTE — Assessment & Plan Note (Signed)
 Counseled on importance of weight management for overall health. Encouraged low calorie, heart healthy diet and moderate intensity exercise 150 minutes weekly. This is 3-5 times weekly for 30-50 minutes each session. Goal should be pace of 3 miles/hours, or walking 1.5 miles in 30 minutes and include strength training. Discussed risks of obesity.

## 2024-09-11 NOTE — Assessment & Plan Note (Signed)
 Stable without medication

## 2024-09-11 NOTE — Assessment & Plan Note (Addendum)
 Borderline, continue to monitor. Advised on heart healthy diet and exercise.  Recommend heart healthy diet such as Mediterranean diet with whole grains, fruits, vegetable, fish, lean meats, nuts, and olive oil. Limit salt. Encouraged moderate walking, 3-5 times/week for 30-50 minutes each session. Aim for at least 150 minutes.week. Goal should be pace of 3 miles/hours, or walking 1.5 miles in 30 minutes. Avoid tobacco products. Avoid excess alcohol. Take medications as prescribed and bring medications and blood pressure log with cuff to each office visit. Seek medical care for chest pain, palpitations, shortness of breath with exertion, dizziness/lightheadedness, vision changes, recurrent headaches, or swelling of extremities.

## 2024-09-11 NOTE — Assessment & Plan Note (Signed)
I have completed forms for Jeremy Ibarra all day program today with the assistance of his mother who provided medical history. Front Information systems manager to fax.

## 2024-09-12 ENCOUNTER — Other Ambulatory Visit: Payer: Self-pay

## 2024-09-12 ENCOUNTER — Ambulatory Visit: Payer: Self-pay | Admitting: Family Medicine

## 2024-09-12 DIAGNOSIS — R7989 Other specified abnormal findings of blood chemistry: Secondary | ICD-10-CM

## 2024-09-12 LAB — CBC WITH DIFFERENTIAL/PLATELET
Absolute Lymphocytes: 2193 {cells}/uL (ref 850–3900)
Absolute Monocytes: 791 {cells}/uL (ref 200–950)
Basophils Absolute: 17 {cells}/uL (ref 0–200)
Basophils Relative: 0.2 %
Eosinophils Absolute: 172 {cells}/uL (ref 15–500)
Eosinophils Relative: 2 %
HCT: 44.3 % (ref 38.5–50.0)
Hemoglobin: 14.6 g/dL (ref 13.2–17.1)
MCH: 26.9 pg — ABNORMAL LOW (ref 27.0–33.0)
MCHC: 33 g/dL (ref 32.0–36.0)
MCV: 81.6 fL (ref 80.0–100.0)
MPV: 10.5 fL (ref 7.5–12.5)
Monocytes Relative: 9.2 %
Neutro Abs: 5427 {cells}/uL (ref 1500–7800)
Neutrophils Relative %: 63.1 %
Platelets: 231 Thousand/uL (ref 140–400)
RBC: 5.43 Million/uL (ref 4.20–5.80)
RDW: 13.1 % (ref 11.0–15.0)
Total Lymphocyte: 25.5 %
WBC: 8.6 Thousand/uL (ref 3.8–10.8)

## 2024-09-12 LAB — HEMOGLOBIN A1C
Hgb A1c MFr Bld: 5.4 % (ref <5.7)
Mean Plasma Glucose: 108 mg/dL
eAG (mmol/L): 6 mmol/L

## 2024-09-12 LAB — LIPID PANEL
Cholesterol: 174 mg/dL (ref <200)
HDL: 39 mg/dL — ABNORMAL LOW (ref >=40)
LDL Cholesterol (Calc): 116 mg/dL — ABNORMAL HIGH
Non-HDL Cholesterol (Calc): 135 mg/dL — ABNORMAL HIGH (ref <130)
Total CHOL/HDL Ratio: 4.5 (calc) (ref <5.0)
Triglycerides: 87 mg/dL (ref <150)

## 2024-09-12 LAB — COMPREHENSIVE METABOLIC PANEL WITH GFR
AG Ratio: 1.6 (calc) (ref 1.0–2.5)
ALT: 15 U/L (ref 9–46)
AST: 14 U/L (ref 10–40)
Albumin: 4.5 g/dL (ref 3.6–5.1)
Alkaline phosphatase (APISO): 60 U/L (ref 36–130)
BUN: 11 mg/dL (ref 7–25)
CO2: 26 mmol/L (ref 20–32)
Calcium: 9.4 mg/dL (ref 8.6–10.3)
Chloride: 104 mmol/L (ref 98–110)
Creat: 1.03 mg/dL (ref 0.60–1.24)
Globulin: 2.9 g/dL (ref 1.9–3.7)
Glucose, Bld: 114 mg/dL — ABNORMAL HIGH (ref 65–99)
Potassium: 4.1 mmol/L (ref 3.5–5.3)
Sodium: 138 mmol/L (ref 135–146)
Total Bilirubin: 0.3 mg/dL (ref 0.2–1.2)
Total Protein: 7.4 g/dL (ref 6.1–8.1)
eGFR: 107 mL/min/1.73m2 (ref >=60)

## 2024-09-12 LAB — TSH: TSH: 0.71 m[IU]/L (ref 0.40–4.50)

## 2024-09-12 LAB — VITAMIN D 25 HYDROXY (VIT D DEFICIENCY, FRACTURES): Vit D, 25-Hydroxy: 8 ng/mL — ABNORMAL LOW (ref 30–100)

## 2024-09-12 MED ORDER — VITAMIN D (ERGOCALCIFEROL) 1.25 MG (50000 UNIT) PO CAPS: 50000.0000 [IU] | ORAL_CAPSULE | ORAL | 0 refills | Status: AC

## 2024-09-12 MED ORDER — CHOLECALCIFEROL 1.25 MG (50000 UT) PO TABS: 1.0000 | ORAL_TABLET | ORAL | 0 refills | Status: AC

## 2024-09-13 ENCOUNTER — Telehealth: Payer: Self-pay

## 2024-09-13 NOTE — Progress Notes (Signed)
 Pt. Mother informed . Of lab review , diet changes needed. And lab reorder needed . Pt. Rescheduled for next lab work up.

## 2024-09-13 NOTE — Telephone Encounter (Signed)
 Completed Adult day center forms are at front desk and mother has been informed.

## 2024-10-16 ENCOUNTER — Ambulatory Visit: Payer: MEDICAID | Admitting: Family Medicine

## 2024-11-22 ENCOUNTER — Ambulatory Visit: Payer: MEDICAID | Admitting: Family Medicine

## 2024-11-22 ENCOUNTER — Encounter: Payer: Self-pay | Admitting: Family Medicine

## 2024-11-22 VITALS — BP 146/98 | HR 128 | Temp 102.9°F | Ht 74.0 in | Wt 317.8 lb

## 2024-11-22 DIAGNOSIS — J069 Acute upper respiratory infection, unspecified: Secondary | ICD-10-CM | POA: Insufficient documentation

## 2024-11-22 LAB — INFLUENZA A AND B AG, IMMUNOASSAY
INFLUENZA A ANTIGEN: NOT DETECTED
INFLUENZA B ANTIGEN: NOT DETECTED

## 2024-11-22 NOTE — Progress Notes (Signed)
 Acute Office Visit  Patient ID: Jeremy Ibarra, male    DOB: Jun 16, 2006, 18 y.o.   MRN: [id number]  PCP: Kayla Jeremy RAMAN, FNP  Chief Complaint  Patient presents with   Acute Visit    Fever and cough, sore throat, headache, ear ache, chest pain and congestion since yesterday      Subjective:     HPI  Discussed the use of AI scribe software for clinical note transcription with the patient, who gave verbal consent to proceed.  History of Present Illness Jeremy Ibarra is a 18 year old male who presents with sore throat and fever. He is accompanied by his mother. Review of systems limited by his limits in verbal communication. Responses are always yes  He has experienced a sore throat since yesterday, with the pain persisting and worsening today. His mother reports a mild cough.  He has a non-productive cough and also reports a headache and feeling warm. He has a history of allergies and took Alka-Seltzer Plus before the visit. He considered taking Aleve but did not.  He has been in contact with others who are sick, particularly at a program he attends. He has a past history of strep throat, though it occurred a long time ago.   Review of Systems  All other systems reviewed and are negative.   Past Medical History:  Diagnosis Date   Autism     History reviewed. No pertinent surgical history.  Current Outpatient Medications on File Prior to Visit  Medication Sig Dispense Refill   acetaminophen  (TYLENOL ) 160 MG/5ML solution Take 320 mg by mouth every 4 (four) hours as needed. For fever     Clindamycin  Phos-Benzoyl Perox gel Apply 1 Application topically daily. May cause bleaching 50 g 0   Cholecalciferol  1.25 MG (50000 UT) TABS Take 1 tablet by mouth once a week. For 8 weeks and then stop.  Return to office for lab draw. (Patient not taking: Reported on 11/22/2024) 8 tablet 0   fluocinonide  (LIDEX ) 0.05 % external solution Apply 1 Application topically 2 (two) times daily.  (Patient not taking: Reported on 11/22/2024) 60 mL 0   fluticasone  (FLONASE ) 50 MCG/ACT nasal spray Place 1 spray into both nostrils daily for 3 days. (Patient taking differently: Place 1 spray into both nostrils as needed for allergies or rhinitis.) 16 g 0   Vitamin D , Ergocalciferol , (DRISDOL ) 1.25 MG (50000 UNIT) CAPS capsule Take 1 capsule (50,000 Units total) by mouth every 7 (seven) days. (Patient not taking: Reported on 11/22/2024) 8 capsule 0   No current facility-administered medications on file prior to visit.    Allergies  Allergen Reactions   Banana     Came up as allergic in an allergy test   Egg Protein-Containing Drug Products     Came up as allergic in an allergy test       Objective:    BP (!) 146/98   Pulse (!) 128   Temp (!) 102.9 F (39.4 C)   Ht 6' 2 (1.88 m)   Wt (!) 317 lb 12.8 oz (144.2 kg)   SpO2 98%   BMI 40.80 kg/m  BP Readings from Last 3 Encounters:  11/22/24 (!) 146/98  09/11/24 (!) 130/90  02/20/24 138/88   Wt Readings from Last 3 Encounters:  11/22/24 (!) 317 lb 12.8 oz (144.2 kg)  09/11/24 (!) 312 lb 9.6 oz (141.8 kg)  02/20/24 (!) 309 lb (140.2 kg) (>99%, Z= 3.16)*   * Growth percentiles are based on  CDC (Boys, 2-20 Years) data.      Physical Exam Vitals and nursing note reviewed.  Constitutional:      Appearance: Normal appearance. He is normal weight.  HENT:     Head: Normocephalic and atraumatic.     Right Ear: Tympanic membrane, ear canal and external ear normal.     Left Ear: Tympanic membrane, ear canal and external ear normal.     Nose: Nose normal.     Mouth/Throat:     Mouth: Mucous membranes are moist.     Pharynx: Oropharynx is clear.  Eyes:     Extraocular Movements: Extraocular movements intact.     Conjunctiva/sclera: Conjunctivae normal.  Cardiovascular:     Rate and Rhythm: Normal rate and regular rhythm.     Pulses: Normal pulses.     Heart sounds: Normal heart sounds.  Pulmonary:     Effort: Pulmonary  effort is normal.     Breath sounds: Normal breath sounds.  Musculoskeletal:     Cervical back: No tenderness.  Lymphadenopathy:     Cervical: No cervical adenopathy.  Skin:    General: Skin is warm and dry.     Capillary Refill: Capillary refill takes less than 2 seconds.  Neurological:     General: No focal deficit present.     Mental Status: He is alert and oriented to person, place, and time. Mental status is at baseline.  Psychiatric:        Mood and Affect: Mood normal.        Behavior: Behavior normal.        Thought Content: Thought content normal.        Judgment: Judgment normal.       No results found for any visits on 11/22/24.     Assessment & Plan:   Problem List Items Addressed This Visit     Viral URI with cough - Primary    Assessment and Plan Assessment & Plan Viral URI - Negative flu and strep swabs.  - Reassured patient that symptoms and exam findings are most consistent with a viral upper respiratory infection and explained lack of efficacy of antibiotics against viruses.  Discussed expected course and features suggestive of secondary bacterial infection.  Continue supportive care. Increase fluid intake with water or electrolyte solution like pedialyte. Encouraged acetaminophen  as needed for fever/pain. Encouraged salt water gargling, chloraseptic spray and throat lozenges. Encouraged OTC guaifenesin. Encouraged saline sinus flushes and/or neti with humidified air.      No orders of the defined types were placed in this encounter.   Return if symptoms worsen or fail to improve.  Jeremy Ibarra Barrio, FNP [facility]

## 2024-11-22 NOTE — Patient Instructions (Signed)
 Your symptoms and exam findings are most consistent with a viral upper respiratory infection. These usually run their course in 5-7 days. Unfortunately, antibiotics don't work against viruses and just increase your risk of other issues such as diarrhea, yeast infections, and resistant infections.  If your symptoms last longer than 10 days and/or you start feeling worse with facial pain, high fever, cough, shortness of breath or start feeling significantly worse, please call us right away to be further evaluated.  Some things that can make you feel better are: - Increased rest - Increasing fluid with water/sugar free electrolytes - Acetaminophen as needed for fever/pain.  - Salt water gargling, chloraseptic spray and throat lozenges - OTC guaifenesin (Mucinex).  - Saline sinus flushes or a neti pot.  - Humidifying the air.

## 2024-11-24 LAB — CULTURE, GROUP A STREP
Micro Number: 17316536
SPECIMEN QUALITY:: ADEQUATE

## 2024-11-24 LAB — STREP GROUP A AG, W/REFLEX TO CULT: Streptococcus Group A AG: NOT DETECTED

## 2024-12-18 ENCOUNTER — Other Ambulatory Visit: Payer: MEDICAID

## 2024-12-21 ENCOUNTER — Other Ambulatory Visit: Payer: MEDICAID

## 2024-12-21 DIAGNOSIS — R748 Abnormal levels of other serum enzymes: Secondary | ICD-10-CM

## 2024-12-21 DIAGNOSIS — I10 Essential (primary) hypertension: Secondary | ICD-10-CM

## 2024-12-21 DIAGNOSIS — R7989 Other specified abnormal findings of blood chemistry: Secondary | ICD-10-CM

## 2024-12-24 ENCOUNTER — Other Ambulatory Visit: Payer: MEDICAID

## 2024-12-24 DIAGNOSIS — R748 Abnormal levels of other serum enzymes: Secondary | ICD-10-CM

## 2024-12-24 DIAGNOSIS — I10 Essential (primary) hypertension: Secondary | ICD-10-CM

## 2024-12-24 DIAGNOSIS — R7989 Other specified abnormal findings of blood chemistry: Secondary | ICD-10-CM

## 2024-12-25 ENCOUNTER — Ambulatory Visit: Payer: Self-pay | Admitting: Family Medicine

## 2024-12-25 LAB — COMPREHENSIVE METABOLIC PANEL WITH GFR
AG Ratio: 1.5 (calc) (ref 1.0–2.5)
ALT: 16 U/L (ref 9–46)
AST: 14 U/L (ref 10–40)
Albumin: 4.8 g/dL (ref 3.6–5.1)
Alkaline phosphatase (APISO): 64 U/L (ref 36–130)
BUN: 12 mg/dL (ref 7–25)
CO2: 21 mmol/L (ref 20–32)
Calcium: 9.7 mg/dL (ref 8.6–10.3)
Chloride: 106 mmol/L (ref 98–110)
Creat: 1.01 mg/dL (ref 0.60–1.24)
Globulin: 3.2 g/dL (ref 1.9–3.7)
Glucose, Bld: 86 mg/dL (ref 65–99)
Potassium: 4.4 mmol/L (ref 3.5–5.3)
Sodium: 143 mmol/L (ref 135–146)
Total Bilirubin: 0.3 mg/dL (ref 0.2–1.2)
Total Protein: 8 g/dL (ref 6.1–8.1)
eGFR: 109 mL/min/1.73m2

## 2024-12-25 LAB — CBC WITH DIFFERENTIAL/PLATELET
Absolute Lymphocytes: 2790 {cells}/uL (ref 850–3900)
Absolute Monocytes: 1160 {cells}/uL — ABNORMAL HIGH (ref 200–950)
Basophils Absolute: 30 {cells}/uL (ref 0–200)
Basophils Relative: 0.3 %
Eosinophils Absolute: 250 {cells}/uL (ref 15–500)
Eosinophils Relative: 2.5 %
HCT: 45.4 % (ref 39.4–51.1)
Hemoglobin: 14.7 g/dL (ref 13.2–17.1)
MCH: 26.6 pg — ABNORMAL LOW (ref 27.0–33.0)
MCHC: 32.4 g/dL (ref 31.6–35.4)
MCV: 82.2 fL (ref 81.4–101.7)
MPV: 10.7 fL (ref 7.5–12.5)
Monocytes Relative: 11.6 %
Neutro Abs: 5770 {cells}/uL (ref 1500–7800)
Neutrophils Relative %: 57.7 %
Platelets: 219 Thousand/uL (ref 140–400)
RBC: 5.52 Million/uL (ref 4.20–5.80)
RDW: 13.2 % (ref 11.0–15.0)
Total Lymphocyte: 27.9 %
WBC: 10 Thousand/uL (ref 3.8–10.8)

## 2024-12-25 LAB — LIPID PANEL
Cholesterol: 164 mg/dL
HDL: 36 mg/dL — ABNORMAL LOW
LDL Cholesterol (Calc): 106 mg/dL — ABNORMAL HIGH
Non-HDL Cholesterol (Calc): 128 mg/dL
Total CHOL/HDL Ratio: 4.6 (calc)
Triglycerides: 121 mg/dL

## 2024-12-25 LAB — VITAMIN D 25 HYDROXY (VIT D DEFICIENCY, FRACTURES): Vit D, 25-Hydroxy: 30 ng/mL (ref 30–100)
